# Patient Record
Sex: Male | Born: 1980 | Race: Black or African American | Hispanic: No | Marital: Married | Smoking: Never smoker
Health system: Southern US, Community
[De-identification: ages and names within clinical notes are randomized; demographics above are authoritative.]

## PROBLEM LIST (undated history)

## (undated) DIAGNOSIS — S0540XA Penetrating wound of orbit with or without foreign body, unspecified eye, initial encounter: Secondary | ICD-10-CM

## (undated) HISTORY — PX: KNEE SURGERY: SHX244

---
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
## 2004-11-12 ENCOUNTER — Emergency Department (HOSPITAL_COMMUNITY): Admission: EM | Admit: 2004-11-12 | Discharge: 2004-11-12 | Payer: Self-pay | Admitting: Emergency Medicine

## 2005-03-18 ENCOUNTER — Emergency Department (HOSPITAL_COMMUNITY): Admission: EM | Admit: 2005-03-18 | Discharge: 2005-03-18 | Payer: Self-pay | Admitting: *Deleted

## 2005-05-28 ENCOUNTER — Emergency Department (HOSPITAL_COMMUNITY): Admission: EM | Admit: 2005-05-28 | Discharge: 2005-05-28 | Payer: Self-pay | Admitting: Family Medicine

## 2005-12-01 ENCOUNTER — Emergency Department (HOSPITAL_COMMUNITY): Admission: EM | Admit: 2005-12-01 | Discharge: 2005-12-01 | Payer: Self-pay | Admitting: Emergency Medicine

## 2006-08-03 ENCOUNTER — Emergency Department (HOSPITAL_COMMUNITY): Admission: EM | Admit: 2006-08-03 | Discharge: 2006-08-03 | Payer: Self-pay | Admitting: Emergency Medicine

## 2007-02-09 ENCOUNTER — Emergency Department (HOSPITAL_COMMUNITY): Admission: EM | Admit: 2007-02-09 | Discharge: 2007-02-09 | Payer: Self-pay | Admitting: Emergency Medicine

## 2007-10-31 ENCOUNTER — Emergency Department (HOSPITAL_COMMUNITY): Admission: EM | Admit: 2007-10-31 | Discharge: 2007-10-31 | Payer: Self-pay | Admitting: Unknown Physician Specialty

## 2012-07-16 ENCOUNTER — Emergency Department (INDEPENDENT_AMBULATORY_CARE_PROVIDER_SITE_OTHER)
Admission: EM | Admit: 2012-07-16 | Discharge: 2012-07-16 | Disposition: A | Discharge status: Home / Self Care | Payer: Self-pay | Source: Home / Self Care | Attending: Emergency Medicine | Admitting: Emergency Medicine

## 2012-07-16 ENCOUNTER — Encounter (HOSPITAL_COMMUNITY): Payer: Self-pay | Admitting: Emergency Medicine

## 2012-07-16 DIAGNOSIS — L0100 Impetigo, unspecified: Secondary | ICD-10-CM

## 2012-07-16 DIAGNOSIS — L259 Unspecified contact dermatitis, unspecified cause: Secondary | ICD-10-CM

## 2012-07-16 HISTORY — DX: Penetrating wound of orbit with or without foreign body, unspecified eye, initial encounter: S05.40XA

## 2012-07-16 MED ORDER — METHYLPREDNISOLONE ACETATE 80 MG/ML IJ SUSP
80.0000 mg | Freq: Once | INTRAMUSCULAR | Status: AC
  Administered 2012-07-16: 80 mg via INTRAMUSCULAR

## 2012-07-16 MED ORDER — PREDNISONE 10 MG PO TABS: ORAL_TABLET | ORAL | Status: DC

## 2012-07-16 MED ORDER — SULFAMETHOXAZOLE-TMP DS 800-160 MG PO TABS: 1.0000 | ORAL_TABLET | Freq: Two times a day (BID) | ORAL | Status: DC

## 2012-07-16 MED ORDER — MUPIROCIN 2 % EX OINT: TOPICAL_OINTMENT | Freq: Three times a day (TID) | CUTANEOUS | Status: DC

## 2012-07-16 MED ORDER — HYDROXYZINE HCL 25 MG PO TABS: 25.0000 mg | ORAL_TABLET | Freq: Four times a day (QID) | ORAL | Status: DC | PRN

## 2012-07-16 MED ORDER — METHYLPREDNISOLONE ACETATE 80 MG/ML IJ SUSP
INTRAMUSCULAR | Status: AC
  Filled 2012-07-16: qty 1

## 2012-07-16 MED ORDER — CEPHALEXIN 500 MG PO CAPS: 500.0000 mg | ORAL_CAPSULE | Freq: Three times a day (TID) | ORAL | Status: DC

## 2012-07-16 NOTE — ED Provider Notes (Signed)
Chief Complaint:   Chief Complaint  Patient presents with  . Rash    History of Present Illness:   Calvin Fletcher is a 32 year old male who presents with a two-day history of rash on his arms and right ear. The rash is very itchy and the rash on the ear hurts and has been draining some fluid and pus. He is exposed to many chemicals at work, although nothing new. He also tried a new body wash. No other exposure to any other allergens. No new medications or foods. No exposure to plants, animals, or poison ivy.  Review of Systems:  Other than noted above, the patient denies any of the following symptoms: Systemic:  No fever, chills, sweats, weight loss, or fatigue. ENT:  No nasal congestion, rhinorrhea, sore throat, swelling of lips, tongue or throat. Resp:  No cough, wheezing, or shortness of breath. Skin:  No rash, itching, nodules, or suspicious lesions.  PMFSH:  Past medical history, family history, social history, meds, and allergies were reviewed.  Physical Exam:   Vital signs:  BP 137/88  Pulse 64  Temp(Src) 98.4 F (36.9 C) (Oral)  Resp 16  SpO2 100% Gen:  Alert, oriented, in no distress. ENT:  Pharynx clear, no intraoral lesions, moist mucous membranes. Lungs:  Clear to auscultation. Skin:  Rash is most marked on the face, head, and neck, it is characterized by round, raised, erythematous maculopapules some of which are crusted, this is most dense on the right ear with crusting and weeping. He has a few lesions on his arms. Skin is otherwise clear.       Course in Urgent Care Center:   Given Depo-Medrol 80 mg IM.  Assessment:  The primary encounter diagnosis was Contact dermatitis. A diagnosis of Impetigo was also pertinent to this visit.  This rash has the appearance of a contact dermatitis possibly with some secondary infection.  Plan:   1.  The following meds were prescribed:   Discharge Medication List as of 07/16/2012 12:38 PM    START taking these medications   Details  cephALEXin (KEFLEX) 500 MG capsule Take 1 capsule (500 mg total) by mouth 3 (three) times daily., Starting 07/16/2012, Until Discontinued, Normal    hydrOXYzine (ATARAX/VISTARIL) 25 MG tablet Take 1 tablet (25 mg total) by mouth every 6 (six) hours as needed for itching., Starting 07/16/2012, Until Discontinued, Normal    mupirocin ointment (BACTROBAN) 2 % Apply topically 3 (three) times daily., Starting 07/16/2012, Until Discontinued, Normal    predniSONE (DELTASONE) 10 MG tablet Take 4 tabs daily for 4 days, 3 tabs daily for 4 days, 2 tabs daily for 4 days, then 1 tab daily for 4 days., Normal    sulfamethoxazole-trimethoprim (BACTRIM DS) 800-160 MG per tablet Take 1 tablet by mouth 2 (two) times daily., Starting 07/16/2012, Until Discontinued, Normal       2.  The patient was instructed in symptomatic care and handouts were given. 3.  The patient was told to return if becoming worse in any way, if no better in 3 or 4 days, and given some red flag symptoms such as fever, or difficulty breathing that would indicate earlier return.     Reuben Likes, MD 07/16/12 [phone]

## 2012-07-16 NOTE — ED Notes (Signed)
Reports rash to right ear since last week, reports noticing rash to arms over the past few days.  Rash itches, somewhat hurts per patient.

## 2016-09-05 ENCOUNTER — Encounter (HOSPITAL_COMMUNITY): Payer: Self-pay | Admitting: Emergency Medicine

## 2016-09-05 ENCOUNTER — Ambulatory Visit (HOSPITAL_COMMUNITY)
Admission: EM | Admit: 2016-09-05 | Discharge: 2016-09-05 | Disposition: A | Discharge status: Home / Self Care | Payer: Self-pay | Attending: Internal Medicine | Admitting: Internal Medicine

## 2016-09-05 DIAGNOSIS — S0101XA Laceration without foreign body of scalp, initial encounter: Secondary | ICD-10-CM

## 2016-09-05 MED ORDER — TETANUS-DIPHTH-ACELL PERTUSSIS 5-2.5-18.5 LF-MCG/0.5 IM SUSP
0.5000 mL | Freq: Once | INTRAMUSCULAR | Status: AC
  Administered 2016-09-05: 0.5 mL via INTRAMUSCULAR

## 2016-09-05 MED ORDER — TETANUS-DIPHTH-ACELL PERTUSSIS 5-2.5-18.5 LF-MCG/0.5 IM SUSP
INTRAMUSCULAR | Status: AC
  Filled 2016-09-05: qty 0.5
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
## 2016-09-05 NOTE — Discharge Instructions (Signed)
We have updated your tetanus. Keep your wound clean, and dry. Monitor for signs of infection, leave the Steri-Strips in place for at least a week before removing. If you have any signs or symptoms of infection, return to clinic
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
## 2017-02-13 ENCOUNTER — Emergency Department (HOSPITAL_COMMUNITY)
Admission: EM | Admit: 2017-02-13 | Discharge: 2017-02-14 | Disposition: A | Discharge status: Home / Self Care | Payer: Self-pay | Attending: Emergency Medicine | Admitting: Emergency Medicine

## 2017-02-13 ENCOUNTER — Emergency Department (HOSPITAL_COMMUNITY): Payer: Self-pay

## 2017-02-13 ENCOUNTER — Encounter (HOSPITAL_COMMUNITY): Payer: Self-pay | Admitting: Emergency Medicine

## 2017-02-13 DIAGNOSIS — Y999 Unspecified external cause status: Secondary | ICD-10-CM | POA: Insufficient documentation

## 2017-02-13 DIAGNOSIS — S12591A Other nondisplaced fracture of sixth cervical vertebra, initial encounter for closed fracture: Secondary | ICD-10-CM | POA: Insufficient documentation

## 2017-02-13 DIAGNOSIS — S12491A Other nondisplaced fracture of fifth cervical vertebra, initial encounter for closed fracture: Secondary | ICD-10-CM | POA: Insufficient documentation

## 2017-02-13 DIAGNOSIS — Z23 Encounter for immunization: Secondary | ICD-10-CM | POA: Insufficient documentation

## 2017-02-13 DIAGNOSIS — S129XXA Fracture of neck, unspecified, initial encounter: Secondary | ICD-10-CM

## 2017-02-13 DIAGNOSIS — R030 Elevated blood-pressure reading, without diagnosis of hypertension: Secondary | ICD-10-CM | POA: Insufficient documentation

## 2017-02-13 DIAGNOSIS — S0101XA Laceration without foreign body of scalp, initial encounter: Secondary | ICD-10-CM | POA: Insufficient documentation

## 2017-02-13 DIAGNOSIS — S0081XA Abrasion of other part of head, initial encounter: Secondary | ICD-10-CM | POA: Insufficient documentation

## 2017-02-13 DIAGNOSIS — Y939 Activity, unspecified: Secondary | ICD-10-CM | POA: Insufficient documentation

## 2017-02-13 DIAGNOSIS — Y929 Unspecified place or not applicable: Secondary | ICD-10-CM | POA: Insufficient documentation

## 2017-02-13 DIAGNOSIS — S0990XA Unspecified injury of head, initial encounter: Secondary | ICD-10-CM | POA: Insufficient documentation

## 2017-02-13 DIAGNOSIS — X58XXXA Exposure to other specified factors, initial encounter: Secondary | ICD-10-CM | POA: Insufficient documentation

## 2017-02-13 DIAGNOSIS — Z043 Encounter for examination and observation following other accident: Secondary | ICD-10-CM | POA: Insufficient documentation

## 2017-02-13 DIAGNOSIS — F10920 Alcohol use, unspecified with intoxication, uncomplicated: Secondary | ICD-10-CM | POA: Insufficient documentation

## 2017-02-13 LAB — I-STAT CHEM 8, ED
BUN: 4 mg/dL — ABNORMAL LOW (ref 6–20)
CALCIUM ION: 1 mmol/L — AB (ref 1.15–1.40)
CREATININE: 1 mg/dL (ref 0.61–1.24)
Chloride: 105 mmol/L (ref 101–111)
GLUCOSE: 115 mg/dL — AB (ref 65–99)
HCT: 48 % (ref 39.0–52.0)
Hemoglobin: 16.3 g/dL (ref 13.0–17.0)
POTASSIUM: 3.7 mmol/L (ref 3.5–5.1)
Sodium: 140 mmol/L (ref 135–145)
TCO2: 23 mmol/L (ref 22–32)

## 2017-02-13 LAB — CBC
HCT: 41.8 % (ref 39.0–52.0)
HEMOGLOBIN: 14 g/dL (ref 13.0–17.0)
MCH: 31 pg (ref 26.0–34.0)
MCHC: 33.5 g/dL (ref 30.0–36.0)
MCV: 92.7 fL (ref 78.0–100.0)
PLATELETS: 220 10*3/uL (ref 150–400)
RBC: 4.51 MIL/uL (ref 4.22–5.81)
RDW: 12.4 % (ref 11.5–15.5)
WBC: 12.4 10*3/uL — AB (ref 4.0–10.5)

## 2017-02-13 LAB — I-STAT CG4 LACTIC ACID, ED: LACTIC ACID, VENOUS: 1.93 mmol/L — AB (ref 0.5–1.9)

## 2017-02-13 LAB — SAMPLE TO BLOOD BANK

## 2017-02-13 MED ORDER — IOPAMIDOL (ISOVUE-300) INJECTION 61%
INTRAVENOUS | Status: AC
  Administered 2017-02-13: 100 mL
  Filled 2017-02-13: qty 100

## 2017-02-13 MED ORDER — SODIUM CHLORIDE 0.9 % IV BOLUS (SEPSIS)
1000.0000 mL | Freq: Once | INTRAVENOUS | Status: AC
  Administered 2017-02-14: 1000 mL via INTRAVENOUS

## 2017-02-13 MED ORDER — SODIUM CHLORIDE 0.9 % IV SOLN
INTRAVENOUS | Status: DC
  Administered 2017-02-14: 05:00:00 via INTRAVENOUS

## 2017-02-13 MED ORDER — LIDOCAINE-EPINEPHRINE (PF) 2 %-1:200000 IJ SOLN
20.0000 mL | Freq: Once | INTRAMUSCULAR | Status: AC
  Administered 2017-02-14: 20 mL via INTRADERMAL
  Filled 2017-02-13: qty 20

## 2017-02-13 MED ORDER — TETANUS-DIPHTH-ACELL PERTUSSIS 5-2.5-18.5 LF-MCG/0.5 IM SUSP
0.5000 mL | Freq: Once | INTRAMUSCULAR | Status: AC
Start: 2017-02-13 — End: 2017-02-13
  Administered 2017-02-13: 0.5 mL via INTRAMUSCULAR

## 2017-02-13 MED ORDER — FENTANYL CITRATE (PF) 100 MCG/2ML IJ SOLN
50.0000 ug | Freq: Once | INTRAMUSCULAR | Status: AC
  Administered 2017-02-14: 50 ug via INTRAVENOUS
  Filled 2017-02-13: qty 2

## 2017-02-13 MED ORDER — TETANUS-DIPHTH-ACELL PERTUSSIS 5-2.5-18.5 LF-MCG/0.5 IM SUSP
INTRAMUSCULAR | Status: AC
  Filled 2017-02-13: qty 0.5

## 2017-02-13 MED ORDER — ONDANSETRON HCL 4 MG/2ML IJ SOLN
4.0000 mg | Freq: Once | INTRAMUSCULAR | Status: AC
  Administered 2017-02-14: 4 mg via INTRAVENOUS
  Filled 2017-02-13: qty 2
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
## 2017-02-14 ENCOUNTER — Emergency Department (HOSPITAL_COMMUNITY): Payer: Self-pay

## 2017-02-14 ENCOUNTER — Encounter (HOSPITAL_COMMUNITY): Payer: Self-pay | Admitting: Emergency Medicine

## 2017-02-14 ENCOUNTER — Telehealth: Payer: Self-pay | Admitting: *Deleted

## 2017-02-14 LAB — URINALYSIS, ROUTINE W REFLEX MICROSCOPIC
Bacteria, UA: NONE SEEN
Bilirubin Urine: NEGATIVE
GLUCOSE, UA: NEGATIVE mg/dL
Ketones, ur: NEGATIVE mg/dL
Leukocytes, UA: NEGATIVE
Nitrite: NEGATIVE
PROTEIN: NEGATIVE mg/dL
RBC / HPF: NONE SEEN RBC/hpf (ref 0–5)
SQUAMOUS EPITHELIAL / LPF: NONE SEEN
Specific Gravity, Urine: 1.009 (ref 1.005–1.030)
WBC UA: NONE SEEN WBC/hpf (ref 0–5)
pH: 6 (ref 5.0–8.0)

## 2017-02-14 LAB — I-STAT CHEM 8, ED
BUN: 6 mg/dL (ref 6–20)
CALCIUM ION: 0.93 mmol/L — AB (ref 1.15–1.40)
Chloride: 105 mmol/L (ref 101–111)
Creatinine, Ser: 1.1 mg/dL (ref 0.61–1.24)
Glucose, Bld: 120 mg/dL — ABNORMAL HIGH (ref 65–99)
HCT: 44 % (ref 39.0–52.0)
HEMOGLOBIN: 15 g/dL (ref 13.0–17.0)
SODIUM: 135 mmol/L (ref 135–145)
TCO2: 27 mmol/L (ref 22–32)

## 2017-02-14 LAB — COMPREHENSIVE METABOLIC PANEL
ALK PHOS: 61 U/L (ref 38–126)
ALT: 40 U/L (ref 17–63)
AST: 70 U/L — ABNORMAL HIGH (ref 15–41)
Albumin: 4 g/dL (ref 3.5–5.0)
Anion gap: 9 (ref 5–15)
BUN: 5 mg/dL — AB (ref 6–20)
CALCIUM: 8.9 mg/dL (ref 8.9–10.3)
CO2: 23 mmol/L (ref 22–32)
CREATININE: 0.83 mg/dL (ref 0.61–1.24)
Chloride: 106 mmol/L (ref 101–111)
GFR, EST AFRICAN AMERICAN: 60 mL/min — AB (ref >60)
GFR, EST NON AFRICAN AMERICAN: 52 mL/min — AB (ref >60)
Glucose, Bld: 116 mg/dL — ABNORMAL HIGH (ref 65–99)
Potassium: 3.9 mmol/L (ref 3.5–5.1)
Sodium: 138 mmol/L (ref 135–145)
Total Bilirubin: 0.6 mg/dL (ref 0.3–1.2)
Total Protein: 6.6 g/dL (ref 6.5–8.1)

## 2017-02-14 LAB — RAPID URINE DRUG SCREEN, HOSP PERFORMED
AMPHETAMINES: NOT DETECTED
BENZODIAZEPINES: NOT DETECTED
Barbiturates: NOT DETECTED
COCAINE: NOT DETECTED
Opiates: NOT DETECTED
Tetrahydrocannabinol: NOT DETECTED

## 2017-02-14 LAB — PROTIME-INR
INR: 0.89
PROTHROMBIN TIME: 12 s (ref 11.4–15.2)

## 2017-02-14 LAB — ETHANOL: ALCOHOL ETHYL (B): 286 mg/dL — AB (ref <10)

## 2017-02-14 MED ORDER — MORPHINE SULFATE (PF) 4 MG/ML IV SOLN
4.0000 mg | Freq: Once | INTRAVENOUS | Status: AC
  Administered 2017-02-14: 4 mg via INTRAVENOUS
  Filled 2017-02-14: qty 1

## 2017-02-14 MED ORDER — OXYCODONE-ACETAMINOPHEN 5-325 MG PO TABS: 1.0000 | ORAL_TABLET | Freq: Four times a day (QID) | ORAL | 0 refills | Status: DC | PRN

## 2017-02-14 MED ORDER — IOPAMIDOL (ISOVUE-370) INJECTION 76%
INTRAVENOUS | Status: AC
  Administered 2017-02-14: 01:00:00
  Filled 2017-02-14: qty 50

## 2017-02-14 MED ORDER — ONDANSETRON 4 MG PO TBDP: 4.0000 mg | ORAL_TABLET | Freq: Three times a day (TID) | ORAL | 0 refills | Status: DC | PRN

## 2017-02-14 NOTE — ED Provider Notes (Signed)
LACERATION REPAIR Performed by: Roxy HorsemanBROWNING, Salaya Holtrop Authorized by: Roxy HorsemanBROWNING, Makana Rostad Consent: Verbal consent obtained. Risks and benefits: risks, benefits and alternatives were discussed Consent given by: patient Patient identity confirmed: provided demographic data Prepped and Draped in normal sterile fashion Wound explored  Laceration Location: scalp  Laceration Length: 10 cm  No Foreign Bodies seen or palpated  Anesthesia: local infiltration  Local anesthetic: lidocaine 1% with epinephrine  Anesthetic total: 8 ml  Irrigation method: syringe Amount of cleaning: standard  Skin closure: 5-0 prolene  Number of sutures: 16  Technique: simple interrupted  Patient tolerance: Patient tolerated the procedure well with no immediate complications.    Roxy HorsemanBrowning, Eesha Schmaltz, PA-C 02/14/17 0235    Ward, Layla MawKristen N, DO 02/14/17 [phone]

## 2017-02-14 NOTE — ED Notes (Signed)
Pt able to ambulated on the hallway with steady gait, no dizziness reported. PO challenge started.

## 2017-02-14 NOTE — Discharge Instructions (Addendum)
Please follow-up with Dr. Yetta BarreJones with neurosurgery in 2 weeks.  You need to wear your aspen cervical collar at all times.  Please do not take it off.  You have 2 cervical fractures at C5 and C6  Your CT scans were otherwise normal.    To find a primary care or specialty doctor please call [phone] or [phone] to access "[facility] Find a Doctor Service."  You may also go on the [facility] website at [email or web address]  There are also multiple [facility], [facility] practices throughout the [place] that are frequently accepting new patients. You may find a clinic that is close to your home and contact them.  [facility] -  [address] [place] [id number] [phone]   [facility] -  [address] [place] [address] [phone]   [facility] [phone]  [place] [phone]

## 2017-02-14 NOTE — Telephone Encounter (Signed)
Pharmacy called related to Rx: zofran being too expensive .Marland Kitchen.Marland Kitchen.EDCM clarified with EDP ([place]) to change Rx to: phenergan 25mg  take 1 tablet Q 8 hours PRN nausea #20.

## 2017-02-21 ENCOUNTER — Emergency Department (HOSPITAL_COMMUNITY)
Admission: EM | Admit: 2017-02-21 | Discharge: 2017-02-21 | Disposition: A | Discharge status: Home / Self Care | Payer: No Typology Code available for payment source | Attending: Emergency Medicine | Admitting: Emergency Medicine

## 2017-02-21 ENCOUNTER — Encounter (HOSPITAL_COMMUNITY): Payer: Self-pay | Admitting: Emergency Medicine

## 2017-02-21 DIAGNOSIS — S0101XD Laceration without foreign body of scalp, subsequent encounter: Secondary | ICD-10-CM | POA: Diagnosis not present

## 2017-02-21 DIAGNOSIS — Z4802 Encounter for removal of sutures: Secondary | ICD-10-CM

## 2017-02-21 NOTE — Discharge Instructions (Signed)
Keep the wound clean and dry.  Continue applying antibacterial ointment.  Closely monitor for any signs of infection, including but not limited to redness or swelling or wound, drainage from the wound, fevers or any other worsening or concerning symptoms.  Return to the emergency department immediately if you experiencing any of these acute symptoms.
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
## 2017-02-21 NOTE — ED Triage Notes (Signed)
Pt here for stiches removed from scalp.

## 2017-05-29 ENCOUNTER — Ambulatory Visit (HOSPITAL_COMMUNITY)
Admission: EM | Admit: 2017-05-29 | Discharge: 2017-05-29 | Disposition: A | Discharge status: Home / Self Care | Payer: Self-pay | Attending: Internal Medicine | Admitting: Internal Medicine

## 2017-05-29 ENCOUNTER — Encounter (HOSPITAL_COMMUNITY): Payer: Self-pay | Admitting: Emergency Medicine

## 2017-05-29 DIAGNOSIS — K219 Gastro-esophageal reflux disease without esophagitis: Secondary | ICD-10-CM

## 2017-05-29 DIAGNOSIS — J029 Acute pharyngitis, unspecified: Secondary | ICD-10-CM

## 2017-05-29 LAB — POCT RAPID STREP A: STREPTOCOCCUS, GROUP A SCREEN (DIRECT): NEGATIVE

## 2017-05-29 MED ORDER — FAMOTIDINE 20 MG PO TABS: 20.0000 mg | ORAL_TABLET | Freq: Two times a day (BID) | ORAL | 0 refills | Status: DC

## 2017-05-29 NOTE — ED Provider Notes (Signed)
05/29/2017 2:56 PM   DOB: September 16, 1980 / MRN: [id number]  SUBJECTIVE:  Calvin Fletcher is a 37 y.o. male presenting sore throat mostly on the left side.  This started a day and a half ago.  He denies nasal congestion.  Does have a mild cough that is abnormal for him.  Denies fever.  States he has been having heartburn more frequently lately and 2 days ago his heartburn was "really bad" and occurs shortly after eating only.  He denies any new medications.  He denies bloody stools.  He is urinating normally.  He has No Known Allergies.   He  has a past medical history of Foreign body behind the eye.    He  reports that  has never smoked. he has never used smokeless tobacco. He reports that he drinks alcohol. He reports that he does not use drugs. He  has no sexual activity history on file. The patient  has a past surgical history that includes Knee surgery.  His family history is not on file.  Review of Systems  Constitutional: Negative for fever.  HENT: Positive for sore throat. Negative for congestion.   Respiratory: Positive for cough.   Cardiovascular: Negative for chest pain.  Gastrointestinal: Positive for heartburn. Negative for abdominal pain, nausea and vomiting.  Skin: Negative for itching and rash.  Neurological: Negative for dizziness.    OBJECTIVE:  BP 108/74 (BP Location: Left Arm)   Pulse 93   Temp 98.1 F (36.7 C) (Oral)   Resp 20   SpO2 97%   Physical Exam  Constitutional: He appears well-developed. He is active and cooperative.  Non-toxic appearance.  HENT:  Right Ear: Hearing, tympanic membrane, external ear and ear canal normal.  Left Ear: Hearing, tympanic membrane, external ear and ear canal normal.  Nose: Nose normal. Right sinus exhibits no maxillary sinus tenderness and no frontal sinus tenderness. Left sinus exhibits no maxillary sinus tenderness and no frontal sinus tenderness.  Mouth/Throat: Uvula is midline, oropharynx is clear and moist and mucous  membranes are normal. No oropharyngeal exudate, posterior oropharyngeal edema or tonsillar abscesses.  Eyes: Conjunctivae are normal. Pupils are equal, round, and reactive to light.  Cardiovascular: Normal rate, regular rhythm, S1 normal, S2 normal, normal heart sounds, intact distal pulses and normal pulses. Exam reveals no gallop and no friction rub.  No murmur heard. Pulmonary/Chest: Effort normal. No stridor. No tachypnea. No respiratory distress. He has no wheezes. He has no rales.  Abdominal: Soft. Normal appearance and bowel sounds are normal. He exhibits no distension and no mass. There is no tenderness. There is no rigidity, no rebound, no guarding and no CVA tenderness. No hernia.  Musculoskeletal: He exhibits no edema.  Lymphadenopathy:       Head (right side): No submandibular and no tonsillar adenopathy present.       Head (left side): No submandibular and no tonsillar adenopathy present.    He has no cervical adenopathy.  Neurological: He is alert.  Skin: Skin is warm and dry. He is not diaphoretic. No pallor.  Vitals reviewed.   Results for orders placed or performed during the hospital encounter of 05/29/17 (from the past 72 hour(s))  POCT rapid strep A [facility])     Status: None   Collection Time: 05/29/17  2:04 PM  Result Value Ref Range   Streptococcus, Group A Screen (Direct) NEGATIVE NEGATIVE    No results found.  ASSESSMENT AND PLAN:  Orders Placed This Encounter  Procedures  .  Culture, group A strep (throat)    Standing Status:   Standing    Number of Occurrences:   1  . POCT rapid strep A [facility]    Standing Status:   Standing    Number of Occurrences:   1     Gastroesophageal reflux disease, esophagitis presence not specified - Patient here with isolated complaint of sore throat without nasal congestion.  His exam is normal.  He does have GERD.  I will treat that etiology and then I am advising Tylenol for throat pain.  [facility] to set up a primary care provider appointment.  Sore throat      The patient is advised to call or return to clinic if he does not see an improvement in symptoms, or to seek the care of the closest emergency department if he worsens with the above plan.   Deliah BostonMichael Colm Lyford, MHS, PA-C 05/29/2017 2:56 PM   Ofilia Neaslark, Denario Bagot L, PA-C 05/29/17 1456

## 2017-05-29 NOTE — ED Triage Notes (Signed)
PT C/O: sore throat onset yest associated w/dsyphaiga and submandibular swelling on left side.   DENIES: fevers  TAKING MEDS: Ibuprofen at 0700 today   A&O x4... NAD... Ambulatory

## 2017-05-29 NOTE — ED Notes (Signed)
Called x2 for room 

## 2017-05-29 NOTE — Discharge Instructions (Signed)
I would suggest taking Tylenol along with the famotidine for your sore throat over the next few days.  I would avoid NSAIDs such as ibuprofen, Aleve, aspirin, Goody's as a may aggravate her stomach and thus make your throat pain worse.

## 2017-06-01 LAB — CULTURE, GROUP A STREP (THRC)

## 2018-04-29 ENCOUNTER — Ambulatory Visit
Admission: EM | Admit: 2018-04-29 | Discharge: 2018-04-29 | Disposition: A | Discharge status: Home / Self Care | Payer: Medicaid Other | Attending: Physician Assistant | Admitting: Physician Assistant

## 2018-04-29 ENCOUNTER — Encounter: Payer: Self-pay | Admitting: Emergency Medicine

## 2018-04-29 DIAGNOSIS — S46912A Strain of unspecified muscle, fascia and tendon at shoulder and upper arm level, left arm, initial encounter: Secondary | ICD-10-CM | POA: Insufficient documentation

## 2018-04-29 MED ORDER — IBUPROFEN 600 MG PO TABS: 600.0000 mg | ORAL_TABLET | Freq: Four times a day (QID) | ORAL | 0 refills | Status: DC | PRN

## 2018-04-29 NOTE — ED Provider Notes (Signed)
[name]    CSN: [id number] Arrival date & time: 04/29/18  1448     History   Chief Complaint Chief Complaint  Patient presents with  . Shoulder Pain    HPI Calvin Fletcher is a 38 y.o. male.   The history is provided by the patient. No language interpreter was used.  Shoulder Pain  Location:  Shoulder Shoulder location:  L shoulder Injury: no   Pain details:    Quality:  Aching   Radiates to:  Does not radiate   Severity:  No pain   Timing:  Constant   Progression:  Worsening Handedness:  Left-handed Dislocation: no   Prior injury to area:  Yes Relieved by:  Nothing Worsened by:  Nothing Associated symptoms: no back pain    Pt reports he lifted weights and began having pain in his left shoulder,  Pt has pain with moving Past Medical History:  Diagnosis Date  . Foreign body behind the eye    bb behind right eye /incident in 1991?    There are no active problems to display for this patient.   Past Surgical History:  Procedure Laterality Date  . KNEE SURGERY         Home Medications    Prior to Admission medications   Medication Sig Start Date End Date Taking? Authorizing Provider  famotidine (PEPCID) 20 MG tablet Take 1 tablet (20 mg total) by mouth 2 (two) times daily. Take first thing in the morning and before supper in the evening. 05/29/17   Ofilia Neas, PA-C  ibuprofen (ADVIL,MOTRIN) 600 MG tablet Take 1 tablet (600 mg total) by mouth every 6 (six) hours as needed. 04/29/18   Elson Areas, PA-C    Family History History reviewed. No pertinent family history.  Social History Social History   Tobacco Use  . Smoking status: Never Smoker  . Smokeless tobacco: Never Used  Substance Use Topics  . Alcohol use: Yes  . Drug use: No     Allergies   Patient has no known allergies.   Review of Systems Review of Systems  Musculoskeletal: Negative for back pain.  All other systems reviewed and are  negative.    Physical Exam Triage Vital Signs ED Triage Vitals [04/29/18 1459]  Enc Vitals Group     BP (!) 147/105     Pulse Rate 91     Resp 16     Temp 97.7 F (36.5 C)     Temp Source Oral     SpO2 98 %     Weight      Height      Head Circumference      Peak Flow      Pain Score 5     Pain Loc      Pain Edu?      Excl. in GC?    No data found.  Updated Vital Signs BP (!) 147/105 (BP Location: Left Arm)   Pulse 91   Temp 97.7 F (36.5 C) (Oral)   Resp 16   SpO2 98%   Visual Acuity Right Eye Distance:   Left Eye Distance:   Bilateral Distance:    Right Eye Near:   Left Eye Near:    Bilateral Near:     Physical Exam Vitals signs and nursing note reviewed.  Constitutional:      Appearance: He is well-developed.  HENT:     Head: Normocephalic and atraumatic.     Right Ear:  Tympanic membrane normal.     Left Ear: Tympanic membrane normal.     Nose: Nose normal.  Eyes:     Conjunctiva/sclera: Conjunctivae normal.  Neck:     Musculoskeletal: Neck supple.  Cardiovascular:     Rate and Rhythm: Normal rate and regular rhythm.     Heart sounds: No murmur.  Pulmonary:     Effort: Pulmonary effort is normal. No respiratory distress.     Breath sounds: Normal breath sounds.  Abdominal:     Palpations: Abdomen is soft.     Tenderness: There is no abdominal tenderness.  Musculoskeletal: Normal range of motion.        General: Tenderness present.  Skin:    General: Skin is warm and dry.  Neurological:     General: No focal deficit present.     Mental Status: He is alert.      UC Treatments / Results  Labs (all labs ordered are listed, but only abnormal results are displayed) Labs Reviewed - No data to display  EKG None  Radiology No results found.  Procedures Procedures (including critical care time)  Medications Ordered in UC Medications - No data to display  Initial Impression / Assessment and Plan / UC Course  I have reviewed the  triage vital signs and the nursing notes.  Pertinent labs & imaging results that were available during my care of the patient were reviewed by me and considered in my medical decision making (see chart for details).     MDM  Pt seems to have a rotator cuff tendonitis .  Pt advised ibuprofen and rest.  Final Clinical Impressions(s) / UC Diagnoses   Final diagnoses:  Shoulder strain, left, initial encounter     Discharge Instructions     Return if any problems.    ED Prescriptions    Medication Sig Dispense Auth. Provider   ibuprofen (ADVIL,MOTRIN) 600 MG tablet Take 1 tablet (600 mg total) by mouth every 6 (six) hours as needed. 30 tablet Elson AreasSofia, Desteni Piscopo K, [place]PA-C     Controlled Substance Prescriptions [place] Controlled Substance Registry consulted? Not Applicable   Elson AreasSofia, Talisa Petrak K, [place]-C 04/29/18 1524
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
## 2018-04-29 NOTE — Discharge Instructions (Addendum)
Return if any problems.

## 2018-04-29 NOTE — ED Notes (Signed)
Patient able to ambulate independently  

## 2018-06-18 ENCOUNTER — Ambulatory Visit
Admission: EM | Admit: 2018-06-18 | Discharge: 2018-06-18 | Disposition: A | Discharge status: Home / Self Care | Payer: Medicaid Other | Attending: Family Medicine | Admitting: Family Medicine

## 2018-06-18 ENCOUNTER — Encounter: Payer: Self-pay | Admitting: Emergency Medicine

## 2018-06-18 DIAGNOSIS — L42 Pityriasis rosea: Secondary | ICD-10-CM

## 2018-06-18 MED ORDER — TRIAMCINOLONE ACETONIDE 0.1 % EX CREA: 1.0000 "application " | TOPICAL_CREAM | Freq: Two times a day (BID) | CUTANEOUS | 0 refills | Status: DC

## 2018-06-18 NOTE — ED Provider Notes (Signed)
[facility]   [id number] 06/18/18 Arrival Time: 1550  CC:  Rash  SUBJECTIVE:  Calvin Fletcher is a 38 y.o. male who presents with rash x 1 month.  Denies precipitating event or trauma.  Denies changes in soaps, detergents, close contacts with similar rash, known trigger or environmental trigger, allergy. Denies medications change or starting a new medication recently.  Does state having exposure to refined oil from vats at work.  Rash is diffuse about the trunk. Denies pain or itching.  Has tried OTC medications without relief.  Denies aggravating factors.  Denies fever, chills, nausea, vomiting, erythema, swelling, discharge, oral lesions, SOB, chest pain, abdominal pain, changes in bowel or bladder function.    ROS: As per HPI.  Past Medical History:  Diagnosis Date  . Foreign body behind the eye    bb behind right eye /incident in 1991?   Past Surgical History:  Procedure Laterality Date  . KNEE SURGERY     No Known Allergies No current facility-administered medications on file prior to encounter.    Current Outpatient Medications on File Prior to Encounter  Medication Sig Dispense Refill  . famotidine (PEPCID) 20 MG tablet Take 1 tablet (20 mg total) by mouth 2 (two) times daily. Take first thing in the morning and before supper in the evening. 20 tablet 0  . ibuprofen (ADVIL,MOTRIN) 600 MG tablet Take 1 tablet (600 mg total) by mouth every 6 (six) hours as needed. 30 tablet 0   Social History   Socioeconomic History  . Marital status: Married    Spouse name: Not on file  . Number of children: Not on file  . Years of education: Not on file  . Highest education level: Not on file  Occupational History  . Not on file  Social Needs  . Financial resource strain: Not on file  . Food insecurity:    Worry: Not on file    Inability: Not on file  . Transportation needs:    Medical: Not on file    Non-medical: Not on file  Tobacco Use  . Smoking status: Never  Smoker  . Smokeless tobacco: Never Used  Substance and Sexual Activity  . Alcohol use: Yes  . Drug use: No  . Sexual activity: Not on file  Lifestyle  . Physical activity:    Days per week: Not on file    Minutes per session: Not on file  . Stress: Not on file  Relationships  . Social connections:    Talks on phone: Not on file    Gets together: Not on file    Attends religious service: Not on file    Active member of club or organization: Not on file    Attends meetings of clubs or organizations: Not on file    Relationship status: Not on file  . Intimate partner violence:    Fear of current or ex partner: Not on file    Emotionally abused: Not on file    Physically abused: Not on file    Forced sexual activity: Not on file  Other Topics Concern  . Not on file  Social History Narrative   ** Merged History Encounter **       History reviewed. No pertinent family history.  OBJECTIVE: Vitals:   06/18/18 1636  BP: (!) 152/96  Pulse: 84  Resp: 16  Temp: 97.8 F (36.6 C)  TempSrc: Oral  SpO2: 97%    General appearance: alert; no distress Head: NCAT Lungs:  clear to auscultation bilaterally Heart: regular rate and rhythm.  Radial pulse 2+ bilaterally Extremities: no edema Skin: warm and dry; crops of oval, sharply delimited, pink papules on anterior and posterior trunk oriented along skin lines, NTTP, no obvious drainage or bleeding (see picture below) Psychological: alert and cooperative; normal mood and affect        ASSESSMENT & PLAN:  1. Pityriasis rosea     Meds ordered this encounter  Medications  . triamcinolone cream (KENALOG) 0.1 %    Sig: Apply 1 application topically 2 (two) times daily.    Dispense:  30 g    Refill:  0    Order Specific Question:   Supervising Provider    Answer:   Eustace Moore [[id number]]   Rest and push fluids Triamcinolone cream prescribed.  Use as needed for symptomatic relief. Follow up with PCP or with [facility] if symptoms persists Return or go to the ER if you have any new or worsening symptoms such as fever, chills, nausea, vomiting, redness, swelling, discharge, if symptoms do not improve with medications, etc...  Reviewed expectations re: course of current medical issues. Questions answered. Outlined signs and symptoms indicating need for more acute intervention. Patient verbalized understanding. After Visit Summary given.   Rennis Harding, PA-C 06/19/18 [phone]

## 2018-06-18 NOTE — ED Notes (Signed)
Patient able to ambulate independently  

## 2018-06-18 NOTE — Discharge Instructions (Addendum)
Rest and push fluids Triamcinolone cream prescribed.  Use as needed for symptomatic relief. Follow up with PCP or with [facility] if symptoms persists Return or go to the ER if you have any new or worsening symptoms such as fever, chills, nausea, vomiting, redness, swelling, discharge, if symptoms do not improve with medications, etc..Marland Kitchen

## 2018-06-18 NOTE — ED Triage Notes (Signed)
Pt presents to [facility] for assessment after having exposure to refined oil from vats from work.  States it started with one spot under his right nipple 1 month ago, and has spread to his whole chest and back.

## 2018-10-28 ENCOUNTER — Ambulatory Visit
Admission: EM | Admit: 2018-10-28 | Discharge: 2018-10-28 | Disposition: A | Discharge status: Home / Self Care | Payer: Medicaid Other | Attending: Physician Assistant | Admitting: Physician Assistant

## 2018-10-28 DIAGNOSIS — L249 Irritant contact dermatitis, unspecified cause: Secondary | ICD-10-CM

## 2018-10-28 MED ORDER — PREDNISONE 50 MG PO TABS: 50.0000 mg | ORAL_TABLET | Freq: Every day | ORAL | 0 refills | Status: DC

## 2018-10-28 MED ORDER — METHYLPREDNISOLONE SODIUM SUCC 125 MG IJ SOLR
80.0000 mg | Freq: Once | INTRAMUSCULAR | Status: AC
  Administered 2018-10-28: 80 mg via INTRAMUSCULAR

## 2018-10-28 NOTE — Discharge Instructions (Signed)
Solumedrol injection in office today. Start prednisone as directed. Avoid irritants to the skin. Monitor for spreading redness, warmth, pain, fever, follow up for reevaluation needed.

## 2018-10-28 NOTE — ED Provider Notes (Signed)
[name]    CSN: [id number] Arrival date & time: 10/28/18  1414     History   Chief Complaint Chief Complaint  Patient presents with  . Poison Ivy    HPI Calvin Fletcher is a 38 y.o. male.   38 year old male comes in for few day history of rash throughout the body.  He believes this is due to poison ivy as he works outside [employer] and has similar experience.  States when symptoms first started, he cleaned his whole body with alcohol, then poured Clorox.  He has since then been using calamine lotion without relief.  States rash throughout the whole body is itching and sensation.  Denies spreading erythema, warmth.  Denies pain.  Denies fever, chills, night sweats.     Past Medical History:  Diagnosis Date  . Foreign body behind the eye    bb behind right eye /incident in 1991?    There are no active problems to display for this patient.   Past Surgical History:  Procedure Laterality Date  . KNEE SURGERY         Home Medications    Prior to Admission medications   Medication Sig Start Date End Date Taking? Authorizing Provider  famotidine (PEPCID) 20 MG tablet Take 1 tablet (20 mg total) by mouth 2 (two) times daily. Take first thing in the morning and before supper in the evening. 05/29/17   Ofilia Neaslark, Michael L, PA-C  ibuprofen (ADVIL,MOTRIN) 600 MG tablet Take 1 tablet (600 mg total) by mouth every 6 (six) hours as needed. 04/29/18   Elson AreasSofia, Leslie K, PA-C  predniSONE (DELTASONE) 50 MG tablet Take 1 tablet (50 mg total) by mouth daily with breakfast. 10/28/18   Cathie HoopsYu, Amy V, PA-C  triamcinolone cream (KENALOG) 0.1 % Apply 1 application topically 2 (two) times daily. 06/18/18   Wurst, [place], PA-C    Family History No family history on file.  Social History Social History   Tobacco Use  . Smoking status: Never Smoker  . Smokeless tobacco: Never Used  Substance Use Topics  . Alcohol use: Yes  . Drug use: No     Allergies   Patient has no  known allergies.   Review of Systems Review of Systems  Reason unable to perform ROS: See HPI as above.     Physical Exam Triage Vital Signs ED Triage Vitals [10/28/18 1422]  Enc Vitals Group     BP (!) 161/92     Pulse Rate 62     Resp 18     Temp 98.1 F (36.7 C)     Temp Source Oral     SpO2 98 %     Weight      Height      Head Circumference      Peak Flow      Pain Score 0     Pain Loc      Pain Edu?      Excl. in GC?    No data found.  Updated Vital Signs BP (!) 161/92 (BP Location: Left Arm)   Pulse 62   Temp 98.1 F (36.7 C) (Oral)   Resp 18   SpO2 98%   Physical Exam Constitutional:      General: He is not in acute distress.    Appearance: He is well-developed. He is not diaphoretic.  HENT:     Head: Normocephalic and atraumatic.  Eyes:     Conjunctiva/sclera: Conjunctivae normal.  Pupils: Pupils are equal, round, and reactive to light.  Skin:    Comments: Maculopapular rash throughout abdomen, bilateral upper extremity. Similar rash to the right lower lateral leg. Surrounding erythema. No swelling. No pain on palpation.  No vesicles/bulla  Neurological:     Mental Status: He is alert and oriented to person, place, and time.      UC Treatments / Results  Labs (all labs ordered are listed, but only abnormal results are displayed) Labs Reviewed - No data to display  EKG   Radiology No results found.  Procedures Procedures (including critical care time)  Medications Ordered in UC Medications  methylPREDNISolone sodium succinate (SOLU-MEDROL) 125 mg/2 mL injection 80 mg (80 mg Intramuscular Given 10/28/18 1441)    Initial Impression / Assessment and Plan / UC Course  I have reviewed the triage vital signs and the nursing notes.  Pertinent labs & imaging results that were available during my care of the patient were reviewed by me and considered in my medical decision making (see chart for details).    History and exam most  consistent with contact/irritant dermatitis.  Discussed this could be due to poison ivy, but also could be due to using Clorox on skin.  Patient requesting injection for symptoms, will provide Solu-Medrol injection.  Will also start prednisone as directed.  Return precaution given.  Patient expresses understanding and agrees to plan.  Final Clinical Impressions(s) / UC Diagnoses   Final diagnoses:  Irritant contact dermatitis, unspecified trigger    ED Prescriptions    Medication Sig Dispense Auth. Provider   predniSONE (DELTASONE) 50 MG tablet Take 1 tablet (50 mg total) by mouth daily with breakfast. 5 tablet Tobin Chad, [place] 10/28/18 1621

## 2018-10-28 NOTE — ED Triage Notes (Signed)
Pt states has poison oak, states hx of same, works outside [employer]

## 2019-01-27 ENCOUNTER — Encounter: Payer: Self-pay | Admitting: Emergency Medicine

## 2019-01-27 ENCOUNTER — Other Ambulatory Visit: Payer: Self-pay

## 2019-01-27 ENCOUNTER — Ambulatory Visit
Admission: EM | Admit: 2019-01-27 | Discharge: 2019-01-27 | Disposition: A | Discharge status: Home / Self Care | Payer: Commercial Managed Care - PPO | Attending: Emergency Medicine | Admitting: Emergency Medicine

## 2019-01-27 DIAGNOSIS — M25572 Pain in left ankle and joints of left foot: Secondary | ICD-10-CM | POA: Diagnosis not present

## 2019-01-27 NOTE — Discharge Instructions (Addendum)
May ice, rest, elevate the area(s) of pain.  May use OTC Tylenol, ibuprofen as needed for pain. Return if you develop worsening pain, difficulty walking, or have an injury.

## 2019-01-27 NOTE — ED Notes (Signed)
Patient able to ambulate independently  
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
## 2019-06-23 ENCOUNTER — Ambulatory Visit
Admission: EM | Admit: 2019-06-23 | Discharge: 2019-06-23 | Disposition: A | Discharge status: Home / Self Care | Payer: Commercial Managed Care - PPO | Attending: Internal Medicine | Admitting: Internal Medicine

## 2019-06-23 ENCOUNTER — Encounter: Payer: Self-pay | Admitting: Emergency Medicine

## 2019-06-23 ENCOUNTER — Other Ambulatory Visit: Payer: Self-pay

## 2019-06-23 DIAGNOSIS — K0889 Other specified disorders of teeth and supporting structures: Secondary | ICD-10-CM

## 2019-06-23 DIAGNOSIS — M67432 Ganglion, left wrist: Secondary | ICD-10-CM

## 2019-06-23 DIAGNOSIS — M6740 Ganglion, unspecified site: Secondary | ICD-10-CM

## 2019-06-23 MED ORDER — IBUPROFEN 800 MG PO TABS: 800.0000 mg | ORAL_TABLET | Freq: Three times a day (TID) | ORAL | 0 refills | Status: DC

## 2019-06-23 MED ORDER — AMOXICILLIN 500 MG PO CAPS: 500.0000 mg | ORAL_CAPSULE | Freq: Two times a day (BID) | ORAL | 0 refills | Status: AC

## 2019-06-23 NOTE — Discharge Instructions (Signed)
Follow up with dentistry as planned Amoxicillin twice daily for 1 week to treat infection in mouth Use anti-inflammatories for pain/swelling. You may take up to 800 mg Ibuprofen every 8 hours with food. You may supplement Ibuprofen with Tylenol [phone] mg every 8 hours.   Cyst- warm compresses, ACE wrap for compression  Follow up if developing signs of infection

## 2019-06-23 NOTE — ED Notes (Signed)
Patient able to ambulate independently  
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
## 2019-06-23 NOTE — ED Triage Notes (Signed)
Pt presents to [facility] for assessment of right dental pain starting 2 weeks ago.  States he has been having to chew more on the right side due to pain on the left.  Patient states he needs three root canals.  Patient also concerned for lump on left wrist, which he states comes and goes.  This time around it has been swollen x 1 month.

## 2019-06-24 ENCOUNTER — Telehealth: Payer: Self-pay | Admitting: Emergency Medicine

## 2019-06-24 NOTE — Telephone Encounter (Signed)
Checked in on patient, discussed medications, and encouraged return call with any continuing questions or concerns.    

## 2019-06-28 ENCOUNTER — Telehealth: Payer: Self-pay

## 2019-06-28 NOTE — Telephone Encounter (Signed)
Calvin Fletcher in office and states that Dr Jonny Ruiz approved to accept this patient as a new patient. Is this okay? CB#: [phone] or [phone]

## 2019-06-28 NOTE — Telephone Encounter (Signed)
Ok with me 

## 2019-07-05 ENCOUNTER — Ambulatory Visit: Payer: Commercial Managed Care - PPO | Admitting: Internal Medicine

## 2020-02-15 ENCOUNTER — Encounter: Payer: Self-pay | Admitting: Emergency Medicine

## 2020-02-15 ENCOUNTER — Other Ambulatory Visit: Payer: Self-pay

## 2020-02-15 ENCOUNTER — Ambulatory Visit
Admission: EM | Admit: 2020-02-15 | Discharge: 2020-02-15 | Disposition: A | Discharge status: Home / Self Care | Payer: Commercial Managed Care - PPO | Attending: Emergency Medicine | Admitting: Emergency Medicine

## 2020-02-15 DIAGNOSIS — K0381 Cracked tooth: Secondary | ICD-10-CM

## 2020-02-15 DIAGNOSIS — K089 Disorder of teeth and supporting structures, unspecified: Secondary | ICD-10-CM

## 2020-02-15 DIAGNOSIS — K0889 Other specified disorders of teeth and supporting structures: Secondary | ICD-10-CM | POA: Diagnosis not present

## 2020-02-15 MED ORDER — AMOXICILLIN 500 MG PO CAPS: 500.0000 mg | ORAL_CAPSULE | Freq: Two times a day (BID) | ORAL | 0 refills | Status: AC

## 2020-02-15 MED ORDER — NAPROXEN 500 MG PO TABS: 500.0000 mg | ORAL_TABLET | Freq: Two times a day (BID) | ORAL | 0 refills | Status: DC

## 2020-02-15 NOTE — Discharge Instructions (Signed)
[facility]:  [place] [facility] Address: [address], [place], [place], [address] Phone: [phone]  - Dr. Civils Address: [address], [place], , [address] Phone: [phone] 

## 2020-02-15 NOTE — ED Triage Notes (Addendum)
Pt presents with left sided tooth pain. States has "bad tooth" and pain flared up last night. States ibuprofen is only given minimal relief.   Pt states has hx of high BP but not on medication.

## 2020-02-15 NOTE — ED Provider Notes (Signed)
[name]    CSN: [id number] Arrival date & time: 02/15/20  0941      History   Chief Complaint Chief Complaint  Patient presents with  . Dental Pain    HPI Calvin Fletcher is a 39 y.o. male  Patient who presents with complaint of toothache. Onset of symptoms was abrupt starting 1 day ago. Patient describes pain as aching and throbbing. The pain does not radiate. Patient denies jaw swelling or fever >101. Pain is aggravated by use. Pain is alleviated by NSAIDS minimally. The patient denies other complaints. Patient has sought treatment by another care provider for this problem for contralateral side: followed by dentist, had that tooth removed. Care prior to arrival consisted of NSAID, with minimal relief.      Past Medical History:  Diagnosis Date  . Foreign body behind the eye    bb behind right eye /incident in 1991?    There are no problems to display for this patient.   Past Surgical History:  Procedure Laterality Date  . KNEE SURGERY         Home Medications    Prior to Admission medications   Medication Sig Start Date End Date Taking? Authorizing Provider  amoxicillin (AMOXIL) 500 MG capsule Take 1 capsule (500 mg total) by mouth 2 (two) times daily for 7 days. 02/15/20 02/22/20  Hall-Potvin, [place], PA-C  ibuprofen (ADVIL) 800 MG tablet Take 1 tablet (800 mg total) by mouth 3 (three) times daily. 06/23/19   Wieters, Hallie C, PA-C  naproxen (NAPROSYN) 500 MG tablet Take 1 tablet (500 mg total) by mouth 2 (two) times daily. 02/15/20   Hall-Potvin, [place], PA-C  triamcinolone cream (KENALOG) 0.1 % Apply 1 application topically 2 (two) times daily. 06/18/18   Wurst, [place], PA-C  famotidine (PEPCID) 20 MG tablet Take 1 tablet (20 mg total) by mouth 2 (two) times daily. Take first thing in the morning and before supper in the evening. 05/29/17 06/23/19  Ofilia Neas, PA-C    Family History History reviewed. No pertinent family  history.  Social History Social History   Tobacco Use  . Smoking status: Never Smoker  . Smokeless tobacco: Never Used  Substance Use Topics  . Alcohol use: Yes  . Drug use: No     Allergies   Patient has no known allergies.   Review of Systems Review of Systems  Constitutional: Negative for fatigue and fever.  HENT: Positive for dental problem. Negative for drooling, trouble swallowing and voice change.   Respiratory: Negative for cough and shortness of breath.   Cardiovascular: Negative for chest pain and palpitations.  Gastrointestinal: Negative for abdominal pain, diarrhea and vomiting.  Musculoskeletal: Negative for arthralgias and myalgias.  Skin: Negative for rash and wound.  Neurological: Negative for speech difficulty and headaches.  All other systems reviewed and are negative.    Physical Exam Triage Vital Signs ED Triage Vitals  Enc Vitals Group     BP 02/15/20 0955 (!) 154/109     Pulse Rate 02/15/20 0955 79     Resp 02/15/20 0955 17     Temp 02/15/20 0955 98.4 F (36.9 C)     Temp Source 02/15/20 0955 Oral     SpO2 02/15/20 0955 96 %     Weight --      Height --      Head Circumference --      Peak Flow --      Pain Score 02/15/20 0954 5  Pain Loc --      Pain Edu? --      Excl. in GC? --    No data found.  Updated Vital Signs BP (!) 154/109 (BP Location: Left Arm)   Pulse 79   Temp 98.4 F (36.9 C) (Oral)   Resp 17   SpO2 96%   Visual Acuity Right Eye Distance:   Left Eye Distance:   Bilateral Distance:    Right Eye Near:   Left Eye Near:    Bilateral Near:     Physical Exam Constitutional:      General: He is not in acute distress. HENT:     Head: Normocephalic and atraumatic.     Mouth/Throat:     Mouth: Mucous membranes are moist.     Comments: Poor dentition.  Left upper front molar with significant decay, cracked.  Surrounding gingival edema with tenderness.  No discharge. Eyes:     General: No scleral icterus.     Pupils: Pupils are equal, round, and reactive to light.  Cardiovascular:     Rate and Rhythm: Normal rate.  Pulmonary:     Effort: Pulmonary effort is normal. No respiratory distress.     Breath sounds: No wheezing.  Musculoskeletal:     Cervical back: Tenderness present.  Lymphadenopathy:     Cervical: Cervical adenopathy present.  Skin:    Coloration: Skin is not jaundiced or pale.  Neurological:     Mental Status: He is alert and oriented to person, place, and time.      UC Treatments / Results  Labs (all labs ordered are listed, but only abnormal results are displayed) Labs Reviewed - No data to display  EKG   Radiology No results found.  Procedures Procedures (including critical care time)  Medications Ordered in UC Medications - No data to display  Initial Impression / Assessment and Plan / UC Course  I have reviewed the triage vital signs and the nursing notes.  Pertinent labs & imaging results that were available during my care of the patient were reviewed by me and considered in my medical decision making (see chart for details).     Afebrile, nontoxic, no airway compromise.  Will cover for infectious process with amoxicillin, have patient follow-up with dentist in 1 week.  Return precautions discussed, pt verbalized understanding and is agreeable to plan. Final Clinical Impressions(s) / UC Diagnoses   Final diagnoses:  Pain, dental  Nontraumatic broken or cracked tooth  Poor dentition     Discharge Instructions     Low-Cost Community Dental Resources:  [facility] Address: [address], [place], [place], [address] Phone: [phone]  - Dr. Lawrence Marseilles Address: [address], [place], [place], [address] Phone: [phone]    ED Prescriptions    Medication Sig Dispense Auth. Provider   amoxicillin (AMOXIL) 500 MG capsule Take 1 capsule (500 mg total) by mouth 2 (two) times daily for 7 days. 14 capsule  Hall-Potvin, [place], PA-C   naproxen (NAPROSYN) 500 MG tablet Take 1 tablet (500 mg total) by mouth 2 (two) times daily. 30 tablet Hall-Potvin, [place], PA-C     I have reviewed the PDMP during this encounter.   Hall-Potvin, [place], [place] 02/15/20 1028

## 2020-03-20 ENCOUNTER — Ambulatory Visit
Admission: EM | Admit: 2020-03-20 | Discharge: 2020-03-20 | Disposition: A | Discharge status: Home / Self Care | Payer: Commercial Managed Care - PPO | Attending: Emergency Medicine | Admitting: Emergency Medicine

## 2020-03-20 ENCOUNTER — Encounter: Payer: Self-pay | Admitting: *Deleted

## 2020-03-20 DIAGNOSIS — J019 Acute sinusitis, unspecified: Secondary | ICD-10-CM | POA: Diagnosis not present

## 2020-03-20 DIAGNOSIS — Z1152 Encounter for screening for COVID-19: Secondary | ICD-10-CM

## 2020-03-20 MED ORDER — AMOXICILLIN-POT CLAVULANATE 875-125 MG PO TABS: 1.0000 | ORAL_TABLET | Freq: Two times a day (BID) | ORAL | 0 refills | Status: AC

## 2020-03-20 MED ORDER — FLUTICASONE PROPIONATE 50 MCG/ACT NA SUSP: 1.0000 | Freq: Every day | NASAL | 0 refills | Status: AC

## 2020-03-20 NOTE — ED Triage Notes (Signed)
Patient reports nasal congestion since Wednesday. This am bodyaches. No fever that he is aware of. States he thought he had sinus infection.   Would like COVID testing, no exposure that he is aware of.

## 2020-03-20 NOTE — ED Provider Notes (Signed)
[name]    CSN: [id number] Arrival date & time: 03/20/20  5916      History   Chief Complaint Chief Complaint  Patient presents with  . Generalized Body Aches  . Nasal Congestion    HPI Calvin Fletcher is a 39 y.o. male presenting today for evaluation of URI symptoms. Symptoms began last Friday, but worsened on wednesday. Pressure and burning in sinuses. Right sided chest discomfort last week, but denies currently.  Denies any cough shortness of breath or chest pain.  Took nyquil cold and sinus. Today worsened. Fevers last week.   HPI  Past Medical History:  Diagnosis Date  . Foreign body behind the eye    bb behind right eye /incident in 1991?    There are no problems to display for this patient.   Past Surgical History:  Procedure Laterality Date  . KNEE SURGERY         Home Medications    Prior to Admission medications   Medication Sig Start Date End Date Taking? Authorizing Provider  amoxicillin-clavulanate (AUGMENTIN) 875-125 MG tablet Take 1 tablet by mouth every 12 (twelve) hours for 7 days. 03/20/20 03/27/20  Wieters, Hallie C, PA-C  fluticasone (FLONASE) 50 MCG/ACT nasal spray Place 1-2 sprays into both nostrils daily. 03/20/20   Wieters, Hallie C, PA-C  triamcinolone cream (KENALOG) 0.1 % Apply 1 application topically 2 (two) times daily. 06/18/18   Wurst, [place], PA-C  famotidine (PEPCID) 20 MG tablet Take 1 tablet (20 mg total) by mouth 2 (two) times daily. Take first thing in the morning and before supper in the evening. 05/29/17 06/23/19  Ofilia Neas, PA-C    Family History History reviewed. No pertinent family history.  Social History Social History   Tobacco Use  . Smoking status: Never Smoker  . Smokeless tobacco: Never Used  Substance Use Topics  . Alcohol use: Yes  . Drug use: No     Allergies   Patient has no known allergies.   Review of Systems Review of Systems  Constitutional: Negative for activity change,  appetite change, chills, fatigue and fever.  HENT: Positive for congestion and rhinorrhea. Negative for ear pain, sinus pressure, sore throat and trouble swallowing.   Eyes: Negative for discharge and redness.  Respiratory: Negative for cough, chest tightness and shortness of breath.   Cardiovascular: Negative for chest pain.  Gastrointestinal: Negative for abdominal pain, diarrhea, nausea and vomiting.  Musculoskeletal: Positive for myalgias.  Skin: Negative for rash.  Neurological: Negative for dizziness, light-headedness and headaches.     Physical Exam Triage Vital Signs ED Triage Vitals  Enc Vitals Group     BP      Pulse      Resp      Temp      Temp src      SpO2      Weight      Height      Head Circumference      Peak Flow      Pain Score      Pain Loc      Pain Edu?      Excl. in GC?    No data found.  Updated Vital Signs BP (!) 179/108 (BP Location: Right Arm)   Pulse 71   Temp 98.1 F (36.7 C) (Oral)   Resp 16   SpO2 97%   Visual Acuity Right Eye Distance:   Left Eye Distance:   Bilateral Distance:    Right  Eye Near:   Left Eye Near:    Bilateral Near:     Physical Exam Vitals and nursing note reviewed.  Constitutional:      Appearance: He is well-developed.     Comments: No acute distress  HENT:     Head: Normocephalic and atraumatic.     Ears:     Comments: Bilateral ears without tenderness to palpation of external auricle, tragus and mastoid, EAC's without erythema or swelling, TM's with good bony landmarks and cone of light. Non erythematous.     Nose: Nose normal.     Mouth/Throat:     Comments: Oral mucosa pink and moist, no tonsillar enlargement or exudate. Posterior pharynx patent and nonerythematous, no uvula deviation or swelling. Normal phonation. Eyes:     Conjunctiva/sclera: Conjunctivae normal.  Cardiovascular:     Rate and Rhythm: Normal rate and regular rhythm.  Pulmonary:     Effort: Pulmonary effort is normal. No  respiratory distress.     Comments: Breathing comfortably at rest, CTABL, no wheezing, rales or other adventitious sounds auscultated Abdominal:     General: There is no distension.  Musculoskeletal:        General: Normal range of motion.     Cervical back: Neck supple.  Skin:    General: Skin is warm and dry.  Neurological:     Mental Status: He is alert and oriented to person, place, and time.      UC Treatments / Results  Labs (all labs ordered are listed, but only abnormal results are displayed) Labs Reviewed  NOVEL CORONAVIRUS, NAA    EKG   Radiology No results found.  Procedures Procedures (including critical care time)  Medications Ordered in UC Medications - No data to display  Initial Impression / Assessment and Plan / UC Course  I have reviewed the triage vital signs and the nursing notes.  Pertinent labs & imaging results that were available during my care of the patient were reviewed by me and considered in my medical decision making (see chart for details).     Covid test pending for screening, your symptoms greater than 1 week, treating with Augmentin for sinusitis, Flonase and continue symptomatic and supportive care for rest and fluids.  Discussed strict return precautions. Patient verbalized understanding and is agreeable with plan.  Final Clinical Impressions(s) / UC Diagnoses   Final diagnoses:  Encounter for screening for COVID-19  Acute sinusitis with symptoms > 10 days     Discharge Instructions     Augmentin twice daily for 1 week for sinus infection Flonase nasal spray 1-2 spray each nostril May supplement with zyrtec/claritin, mucinex Rest and fluids Tylenol and Ibuprofen COVID test pending- check my chart for results, may return to work if results negative     ED Prescriptions    Medication Sig Dispense Auth. Provider   amoxicillin-clavulanate (AUGMENTIN) 875-125 MG tablet Take 1 tablet by mouth every 12 (twelve) hours for  7 days. 14 tablet Wieters, Hallie C, PA-C   fluticasone (FLONASE) 50 MCG/ACT nasal spray Place 1-2 sprays into both nostrils daily. 16 g Wieters, [place] C, PA-C     PDMP not reviewed this encounter.   Lew Dawes, [place] 03/20/20 [phone]

## 2020-03-20 NOTE — Discharge Instructions (Signed)
Augmentin twice daily for 1 week for sinus infection Flonase nasal spray 1-2 spray each nostril May supplement with zyrtec/claritin, mucinex Rest and fluids Tylenol and Ibuprofen COVID test pending- check my chart for results, may return to work if results negative

## 2020-03-21 LAB — SARS-COV-2, NAA 2 DAY TAT

## 2020-03-21 LAB — NOVEL CORONAVIRUS, NAA: SARS-CoV-2, NAA: NOT DETECTED

## 2020-04-22 ENCOUNTER — Other Ambulatory Visit: Payer: Self-pay

## 2020-04-22 ENCOUNTER — Ambulatory Visit
Admission: EM | Admit: 2020-04-22 | Discharge: 2020-04-22 | Disposition: A | Discharge status: Home / Self Care | Payer: Commercial Managed Care - PPO | Attending: Emergency Medicine | Admitting: Emergency Medicine

## 2020-04-22 ENCOUNTER — Encounter: Payer: Self-pay | Admitting: Emergency Medicine

## 2020-04-22 DIAGNOSIS — L089 Local infection of the skin and subcutaneous tissue, unspecified: Secondary | ICD-10-CM | POA: Diagnosis not present

## 2020-04-22 DIAGNOSIS — W57XXXA Bitten or stung by nonvenomous insect and other nonvenomous arthropods, initial encounter: Secondary | ICD-10-CM

## 2020-04-22 DIAGNOSIS — T07XXXA Unspecified multiple injuries, initial encounter: Secondary | ICD-10-CM

## 2020-04-22 MED ORDER — TRIAMCINOLONE ACETONIDE 0.1 % EX CREA: 1.0000 "application " | TOPICAL_CREAM | Freq: Two times a day (BID) | CUTANEOUS | 0 refills | Status: AC

## 2020-04-22 MED ORDER — CEFTRIAXONE SODIUM 1 G IJ SOLR
1.0000 g | Freq: Once | INTRAMUSCULAR | Status: AC
  Administered 2020-04-22: 1 g via INTRAMUSCULAR

## 2020-04-22 MED ORDER — DOXYCYCLINE HYCLATE 100 MG PO CAPS: 100.0000 mg | ORAL_CAPSULE | Freq: Two times a day (BID) | ORAL | 0 refills | Status: AC

## 2020-04-22 NOTE — Discharge Instructions (Addendum)
We gave you a shot of Rocephin Continue with doxycycline twice daily for the next 10 days Triamcinolone to help with localized swelling inflammation May use over-the-counter antihistamine such as daily cetirizine/Zyrtec or loratadine/Claritin, supple with Benadryl to further help with itching  Follow-up if any symptoms not improving or worsening

## 2020-04-22 NOTE — ED Provider Notes (Signed)
[name]    CSN: [id number] Arrival date & time: 04/22/20  1253      History   Chief Complaint Chief Complaint  Patient presents with  . Rash    HPI Calvin Fletcher is a 40 y.o. male presenting today for evaluation of a rash.  Patient reports that he has had a rash to the left side of his body arms chest and head.  Associated with itching and swelling.  Began yesterday.  Denies any fevers dizziness or lightheadedness.  Has had a lot of itching to areas, some pain to lesion on chest.  Expresses concern about affordability of medicines and requesting shot if possible today.  HPI  Past Medical History:  Diagnosis Date  . Foreign body behind the eye    bb behind right eye /incident in 1991?    There are no problems to display for this patient.   Past Surgical History:  Procedure Laterality Date  . KNEE SURGERY         Home Medications    Prior to Admission medications   Medication Sig Start Date End Date Taking? Authorizing Provider  doxycycline (VIBRAMYCIN) 100 MG capsule Take 1 capsule (100 mg total) by mouth 2 (two) times daily for 10 days. 04/22/20 05/02/20 Yes Rayleen Wyrick C, PA-C  triamcinolone (KENALOG) 0.1 % Apply 1 application topically 2 (two) times daily. 04/22/20  Yes Oney Tatlock C, PA-C  fluticasone (FLONASE) 50 MCG/ACT nasal spray Place 1-2 sprays into both nostrils daily. 03/20/20   Tyquon Near C, PA-C  famotidine (PEPCID) 20 MG tablet Take 1 tablet (20 mg total) by mouth 2 (two) times daily. Take first thing in the morning and before supper in the evening. 05/29/17 06/23/19  Ofilia Neas, PA-C    Family History History reviewed. No pertinent family history.  Social History Social History   Tobacco Use  . Smoking status: Never Smoker  . Smokeless tobacco: Never Used  Substance Use Topics  . Alcohol use: Yes  . Drug use: No     Allergies   Patient has no known allergies.   Review of Systems Review of Systems   Constitutional: Negative for fatigue and fever.  Eyes: Negative for redness, itching and visual disturbance.  Respiratory: Negative for shortness of breath.   Cardiovascular: Negative for chest pain and leg swelling.  Gastrointestinal: Negative for nausea and vomiting.  Musculoskeletal: Negative for arthralgias and myalgias.  Skin: Positive for color change and rash. Negative for wound.  Neurological: Negative for dizziness, syncope, weakness, light-headedness and headaches.     Physical Exam Triage Vital Signs ED Triage Vitals  Enc Vitals Group     BP 04/22/20 1436 (!) 174/91     Pulse Rate 04/22/20 1436 81     Resp 04/22/20 1436 18     Temp 04/22/20 1436 98 F (36.7 C)     Temp Source 04/22/20 1436 Oral     SpO2 04/22/20 1436 98 %     Weight --      Height --      Head Circumference --      Peak Flow --      Pain Score 04/22/20 1437 0     Pain Loc --      Pain Edu? --      Excl. in GC? --    No data found.  Updated Vital Signs BP (!) 174/91 (BP Location: Left Arm)   Pulse 81   Temp 98 F (36.7 C) (Oral)  Resp 18   SpO2 98%   Visual Acuity Right Eye Distance:   Left Eye Distance:   Bilateral Distance:    Right Eye Near:   Left Eye Near:    Bilateral Near:     Physical Exam Vitals and nursing note reviewed.  Constitutional:      Appearance: He is well-developed and well-nourished.     Comments: No acute distress  HENT:     Head: Normocephalic and atraumatic.     Nose: Nose normal.  Eyes:     Conjunctiva/sclera: Conjunctivae normal.  Cardiovascular:     Rate and Rhythm: Normal rate.  Pulmonary:     Effort: Pulmonary effort is normal. No respiratory distress.  Abdominal:     General: There is no distension.  Musculoskeletal:        General: Normal range of motion.     Cervical back: Neck supple.  Skin:    General: Skin is warm and dry.     Comments: Multiple scabbed lesions noted to scalp, left ear, left chest, left arm with evidence of  excoriation, various lesions with surrounding erythema slight warmth and swelling, some lesions oozing serous fluid  Neurological:     Mental Status: He is alert and oriented to person, place, and time.  Psychiatric:        Mood and Affect: Mood and affect normal.      UC Treatments / Results  Labs (all labs ordered are listed, but only abnormal results are displayed) Labs Reviewed - No data to display  EKG   Radiology No results found.  Procedures Procedures (including critical care time)  Medications Ordered in UC Medications  cefTRIAXone (ROCEPHIN) injection 1 g (1 g Intramuscular Given 04/22/20 1520)    Initial Impression / Assessment and Plan / UC Course  I have reviewed the triage vital signs and the nursing notes.  Pertinent labs & imaging results that were available during my care of the patient were reviewed by me and considered in my medical decision making (see chart for details).     Opting to treat for infection with doxycycline, giving Rocephin prior to discharge given possible delay in obtaining oral antibiotics, triamcinolone topically to help with localized inflammation.  Monitor for gradual healing of these wounds.  Discussed strict return precautions. Patient verbalized understanding and is agreeable with plan.  Final Clinical Impressions(s) / UC Diagnoses   Final diagnoses:  Insect bites of multiple sites, infected     Discharge Instructions     We gave you a shot of Rocephin Continue with doxycycline twice daily for the next 10 days Triamcinolone to help with localized swelling inflammation May use over-the-counter antihistamine such as daily cetirizine/Zyrtec or loratadine/Claritin, supple with Benadryl to further help with itching  Follow-up if any symptoms not improving or worsening    ED Prescriptions    Medication Sig Dispense Auth. Provider   doxycycline (VIBRAMYCIN) 100 MG capsule Take 1 capsule (100 mg total) by mouth 2 (two) times  daily for 10 days. 20 capsule Marwin Primmer C, PA-C   triamcinolone (KENALOG) 0.1 % Apply 1 application topically 2 (two) times daily. 80 g Alarik Radu, [place] C, PA-C     PDMP not reviewed this encounter.   Lew Dawes, [place] 04/22/20 1539

## 2020-04-22 NOTE — ED Triage Notes (Signed)
Pt here for rash to left side of body or arms, chest, ear and head that is itching and swollen on top of his head; pt sts noticed yesterday

## 2022-08-20 ENCOUNTER — Ambulatory Visit
Admission: EM | Admit: 2022-08-20 | Discharge: 2022-08-20 | Disposition: A | Discharge status: Home / Self Care | Payer: Commercial Managed Care - PPO | Attending: Internal Medicine | Admitting: Internal Medicine

## 2022-08-20 DIAGNOSIS — S0993XA Unspecified injury of face, initial encounter: Secondary | ICD-10-CM

## 2022-08-20 MED ORDER — LIDOCAINE VISCOUS HCL 2 % MT SOLN: 15.0000 mL | Freq: Four times a day (QID) | OROMUCOSAL | 0 refills | Status: AC | PRN

## 2022-08-20 NOTE — ED Provider Notes (Signed)
[name]    CSN: [id number] Arrival date & time: 08/20/22  1238      History   Chief Complaint No chief complaint on file.   HPI Calvin Fletcher is a 42 y.o. male.   Patient presents to urgent care for evaluation of pain to the right posterior tongue after he "bit off a piece of his tongue" while eating steak on 3 days ago on Saturday Aug 17, 2022. He was drinking alcohol at the time of the injury and states he accidentally bit his tongue against his right back tooth.  He did not feel much pain in the moment as he had had a lot of alcohol to drink.  Injury bled a little bit initially but bleeding stopped quickly.  He is now complaining of pain with eating and tongue movement. States he has had minimal amount to eat since injury happened and has been eating on the left side of his mouth due to pain. Denies history of diabetes and immunosuppression. States he has been drinking alcohol at night to cope with the pain without much relief.      Past Medical History:  Diagnosis Date   Foreign body behind the eye    bb behind right eye /incident in 1991?    There are no problems to display for this patient.   Past Surgical History:  Procedure Laterality Date   KNEE SURGERY         Home Medications    Prior to Admission medications   Medication Sig Start Date End Date Taking? Authorizing Provider  lidocaine (XYLOCAINE) 2 % solution Use as directed 15 mLs in the mouth or throat every 6 (six) hours as needed for mouth pain. 08/20/22  Yes Carlisle Beers, FNP  fluticasone (FLONASE) 50 MCG/ACT nasal spray Place 1-2 sprays into both nostrils daily. 03/20/20   Wieters, Hallie C, PA-C  triamcinolone (KENALOG) 0.1 % Apply 1 application topically 2 (two) times daily. 04/22/20   Wieters, Hallie C, PA-C  famotidine (PEPCID) 20 MG tablet Take 1 tablet (20 mg total) by mouth 2 (two) times daily. Take first thing in the morning and before supper in the evening. 05/29/17  06/23/19  Ofilia Neas, PA-C    Family History History reviewed. No pertinent family history.  Social History Social History   Tobacco Use   Smoking status: Never   Smokeless tobacco: Never  Substance Use Topics   Alcohol use: Yes   Drug use: Yes    Types: Marijuana     Allergies   Patient has no known allergies.   Review of Systems Review of Systems Per HPI  Physical Exam Triage Vital Signs ED Triage Vitals  Enc Vitals Group     BP 08/20/22 1249 130/85     Pulse Rate 08/20/22 1249 93     Resp 08/20/22 1249 18     Temp 08/20/22 1249 98.1 F (36.7 C)     Temp Source 08/20/22 1249 Oral     SpO2 08/20/22 1249 94 %     Weight --      Height --      Head Circumference --      Peak Flow --      Pain Score 08/20/22 1251 5     Pain Loc --      Pain Edu? --      Excl. in GC? --    No data found.  Updated Vital Signs BP 130/85 (BP Location: Left  Arm)   Pulse 93   Temp 98.1 F (36.7 C) (Oral)   Resp 18   SpO2 94%   Visual Acuity Right Eye Distance:   Left Eye Distance:   Bilateral Distance:    Right Eye Near:   Left Eye Near:    Bilateral Near:     Physical Exam Vitals and nursing note reviewed.  Constitutional:      Appearance: He is not ill-appearing or toxic-appearing.  HENT:     Head: Normocephalic and atraumatic.     Right Ear: Hearing and external ear normal.     Left Ear: Hearing and external ear normal.     Nose: Nose normal.     Mouth/Throat:     Lips: Pink.     Mouth: Mucous membranes are moist. Injury present.     Tongue: Tongue does not deviate from midline.     Palate: No mass and lesions.     Pharynx: Oropharynx is clear. Uvula midline. No pharyngeal swelling, oropharyngeal exudate, posterior oropharyngeal erythema or uvula swelling.     Tonsils: No tonsillar exudate or tonsillar abscesses.   Eyes:     General: Lids are normal. Vision grossly intact. Gaze aligned appropriately.     Extraocular Movements: Extraocular movements  intact.     Conjunctiva/sclera: Conjunctivae normal.  Pulmonary:     Effort: Pulmonary effort is normal.  Musculoskeletal:     Cervical back: Normal range of motion and neck supple. No tenderness.  Lymphadenopathy:     Cervical: No cervical adenopathy.  Skin:    General: Skin is warm and dry.     Capillary Refill: Capillary refill takes less than 2 seconds.     Findings: No rash.  Neurological:     General: No focal deficit present.     Mental Status: He is alert and oriented to person, place, and time. Mental status is at baseline.     Cranial Nerves: No dysarthria or facial asymmetry.  Psychiatric:        Mood and Affect: Mood normal.        Speech: Speech normal.        Behavior: Behavior normal.        Thought Content: Thought content normal.        Judgment: Judgment normal.        UC Treatments / Results  Labs (all labs ordered are listed, but only abnormal results are displayed) Labs Reviewed - No data to display  EKG   Radiology No results found.  Procedures Procedures (including critical care time)  Medications Ordered in UC Medications - No data to display  Initial Impression / Assessment and Plan / UC Course  I have reviewed the triage vital signs and the nursing notes.  Pertinent labs & imaging results that were available during my care of the patient were reviewed by me and considered in my medical decision making (see chart for details).   1. Injury of tongue Tongue injury will likely heal quickly. May eat and drink soft foods to avoid irritation to area. May purchase dental wax to place over tooth to avoid further tongue irritation and pain.  Viscous lidocaine sent to pharmacy to be used every 6 hours as needed for pain to the tongue.  Encouraged to schedule an appointment with dentist for soon as possible for follow-up to ensure tongue is healing appropriately.  No signs of infection or indication for antibiotic therapy today.  May use ibuprofen  every 6 hours as needed  for pain.   Discussed physical exam and available lab work findings in clinic with patient.  Counseled patient regarding appropriate use of medications and potential side effects for all medications recommended or prescribed today. Discussed red flag signs and symptoms of worsening condition,when to call the PCP office, return to urgent care, and when to seek higher level of care in the emergency department. Patient verbalizes understanding and agreement with plan. All questions answered. Patient discharged in stable condition.    Final Clinical Impressions(s) / UC Diagnoses   Final diagnoses:  Injury of tongue, initial encounter     Discharge Instructions      Your tongue injury will heal over the next few weeks. Schedule an appointment with your dentist for follow-up. You may purchase dental wax to place over your tooth to prevent the tooth from rubbing the sore area on your tongue. Use lidocaine every 6 hours as needed for pain to the tongue. Take ibuprofen 600mg  every 6 hours as needed for pain. Eat soft foods like applesauce, jello, smoothies, etc.  Return as needed.     ED Prescriptions     Medication Sig Dispense Auth. Provider   lidocaine (XYLOCAINE) 2 % solution Use as directed 15 mLs in the mouth or throat every 6 (six) hours as needed for mouth pain. 100 mL Carlisle Beers, FNP      PDMP not reviewed this encounter.   Carlisle Beers, [place] 08/20/22 1319

## 2022-08-20 NOTE — Discharge Instructions (Addendum)
Your tongue injury will heal over the next few weeks. Schedule an appointment with your dentist for follow-up. You may purchase dental wax to place over your tooth to prevent the tooth from rubbing the sore area on your tongue. Use lidocaine every 6 hours as needed for pain to the tongue. Take ibuprofen 600mg  every 6 hours as needed for pain. Eat soft foods like applesauce, jello, smoothies, etc.  Return as needed.

## 2022-08-20 NOTE — ED Triage Notes (Signed)
Pt reports he has a chipped a piece of his tongue on his chipped tooth x 3 days. Hurts to eat and drink because it is hard to swallow.

## 2023-01-23 ENCOUNTER — Other Ambulatory Visit: Payer: Self-pay

## 2023-01-23 ENCOUNTER — Ambulatory Visit
Admission: EM | Admit: 2023-01-23 | Discharge: 2023-01-23 | Disposition: A | Discharge status: Home / Self Care | Payer: Medicaid Other | Attending: Family Medicine | Admitting: Family Medicine

## 2023-01-23 DIAGNOSIS — R030 Elevated blood-pressure reading, without diagnosis of hypertension: Secondary | ICD-10-CM

## 2023-01-23 NOTE — ED Triage Notes (Signed)
C/o hypertension, was getting fit tested and bp was high, was sent for clearance.

## 2023-01-23 NOTE — Discharge Instructions (Addendum)
Advised patient to check blood pressure in the morning prior to eating for the next 10 days and to log blood pressure measurement values so that PCP may better understand daily blood pressure trends.  Advised patient that we will follow-up with him tomorrow Friday, 01/24/2023 once results are received.  Advised if symptoms worsen and/or unresolved please go to nearest emergency department for further evaluation.

## 2023-01-23 NOTE — ED Provider Notes (Signed)
[facility]    CSN: [id number] Arrival date & time: 01/23/23  1252      History   Chief Complaint Chief Complaint  Patient presents with   Hypertension    HPI Calvin Fletcher is a 42 y.o. male.   HPI Pleasant 42 year old male presents with elevated blood pressure during FIT testing.  Blood pressure was too high and was sent here for clearance.  Initial BP in triage was 168/119, then manual was 160/110.  Past Medical History:  Diagnosis Date   Foreign body behind the eye    bb behind right eye /incident in 1991?    There are no problems to display for this patient.   Past Surgical History:  Procedure Laterality Date   KNEE SURGERY         Home Medications    Prior to Admission medications   Medication Sig Start Date End Date Taking? Authorizing Provider  fluticasone (FLONASE) 50 MCG/ACT nasal spray Place 1-2 sprays into both nostrils daily. 03/20/20   Wieters, Hallie C, PA-C  lidocaine (XYLOCAINE) 2 % solution Use as directed 15 mLs in the mouth or throat every 6 (six) hours as needed for mouth pain. 08/20/22   Carlisle Beers, FNP  triamcinolone (KENALOG) 0.1 % Apply 1 application topically 2 (two) times daily. 04/22/20   Wieters, Hallie C, PA-C  famotidine (PEPCID) 20 MG tablet Take 1 tablet (20 mg total) by mouth 2 (two) times daily. Take first thing in the morning and before supper in the evening. 05/29/17 06/23/19  Ofilia Neas, PA-C    Family History History reviewed. No pertinent family history.  Social History Social History   Tobacco Use   Smoking status: Never   Smokeless tobacco: Never  Substance Use Topics   Alcohol use: Yes   Drug use: Yes    Types: Marijuana     Allergies   Patient has no known allergies.   Review of Systems Review of Systems   Physical Exam Triage Vital Signs ED Triage Vitals  Encounter Vitals Group     BP 01/23/23 1259 (!) 168/119     Systolic BP Percentile --      Diastolic BP Percentile --       Pulse Rate 01/23/23 1259 85     Resp 01/23/23 1259 16     Temp 01/23/23 1259 98.5 F (36.9 C)     Temp src --      SpO2 01/23/23 1259 99 %     Weight --      Height --      Head Circumference --      Peak Flow --      Pain Score 01/23/23 1301 0     Pain Loc --      Pain Education --      Exclude from Growth Chart --    No data found.  Updated Vital Signs BP (!) 160/110 (BP Location: Left Arm)   Pulse 85   Temp 98.5 F (36.9 C)   Resp 16   SpO2 99%       Physical Exam Vitals and nursing note reviewed.  Constitutional:      Appearance: Normal appearance. He is normal weight.  HENT:     Head: Normocephalic and atraumatic.     Right Ear: Tympanic membrane, ear canal and external ear normal.     Left Ear: Tympanic membrane, ear canal and external ear normal.     Mouth/Throat:  Mouth: Mucous membranes are moist.     Pharynx: Oropharynx is clear.  Eyes:     Extraocular Movements: Extraocular movements intact.     Conjunctiva/sclera: Conjunctivae normal.     Pupils: Pupils are equal, round, and reactive to light.  Cardiovascular:     Rate and Rhythm: Normal rate and regular rhythm.     Pulses: Normal pulses.     Heart sounds: Normal heart sounds.     Comments: Hypertensive Pulmonary:     Effort: Pulmonary effort is normal.     Breath sounds: Normal breath sounds. No wheezing, rhonchi or rales.  Musculoskeletal:        General: Normal range of motion.     Cervical back: Normal range of motion and neck supple.  Skin:    General: Skin is warm and dry.  Neurological:     General: No focal deficit present.     Mental Status: He is alert and oriented to person, place, and time. Mental status is at baseline.  Psychiatric:        Mood and Affect: Mood normal.        Behavior: Behavior normal.      UC Treatments / Results  Labs (all labs ordered are listed, but only abnormal results are displayed) Labs Reviewed  COMPREHENSIVE METABOLIC PANEL  CBC WITH  DIFFERENTIAL/PLATELET    EKG   Radiology No results found.  Procedures Procedures (including critical care time)  Medications Ordered in UC Medications - No data to display  Initial Impression / Assessment and Plan / UC Course  I have reviewed the triage vital signs and the nursing notes.  Pertinent labs & imaging results that were available during my care of the patient were reviewed by me and considered in my medical decision making (see chart for details).     MDM: 1.  Elevated blood pressure reading without diagnosis of hypertension-CBC with differential, CMP ordered.  Patient reports has no health insurance and is dependent on getting this job with [employer].  Patient is currently not followed by PCP.  Advised patient to check blood pressure in the morning prior to eating for the next 10 days and to log blood pressure measurement values so that PCP may better understand daily blood pressure trends. Advised patient to check blood pressure in the morning prior to eating for the next 10 days and to log blood pressure measurement values so that PCP may better understand daily blood pressure trends.  Advised patient that we will follow-up with him tomorrow Friday, 01/24/2023 once results are received.  Advised if symptoms worsen and/or unresolved please go to nearest emergency department for further evaluation.  Final Clinical Impressions(s) / UC Diagnoses   Final diagnoses:  Elevated blood-pressure reading without diagnosis of hypertension     Discharge Instructions      Advised patient to check blood pressure in the morning prior to eating for the next 10 days and to log blood pressure measurement values so that PCP may better understand daily blood pressure trends.  Advised patient that we will follow-up with him tomorrow Friday, 01/24/2023 once results are received.  Advised if symptoms worsen and/or unresolved please go to nearest emergency department for further  evaluation.     ED Prescriptions   None    PDMP not reviewed this encounter.   Trevor Iha, FNP 01/23/23 1400

## 2023-01-24 ENCOUNTER — Telehealth: Payer: Self-pay

## 2023-01-24 ENCOUNTER — Telehealth: Payer: Self-pay | Admitting: Family Medicine

## 2023-01-24 LAB — CBC WITH DIFFERENTIAL/PLATELET
Basophils Absolute: 0.1 10*3/uL (ref 0.0–0.2)
Basos: 1 %
EOS (ABSOLUTE): 0 10*3/uL (ref 0.0–0.4)
Eos: 0 %
Hematocrit: 48.2 % (ref 37.5–51.0)
Hemoglobin: 16.5 g/dL (ref 13.0–17.7)
Immature Grans (Abs): 0 10*3/uL (ref 0.0–0.1)
Immature Granulocytes: 0 %
Lymphocytes Absolute: 2.9 10*3/uL (ref 0.7–3.1)
Lymphs: 34 %
MCH: 32.6 pg (ref 26.6–33.0)
MCHC: 34.2 g/dL (ref 31.5–35.7)
MCV: 95 fL (ref 79–97)
Monocytes Absolute: 0.6 10*3/uL (ref 0.1–0.9)
Monocytes: 7 %
Neutrophils Absolute: 5 10*3/uL (ref 1.4–7.0)
Neutrophils: 58 %
Platelets: 229 10*3/uL (ref 150–450)
RBC: 5.06 x10E6/uL (ref 4.14–5.80)
RDW: 11.4 % — ABNORMAL LOW (ref 11.6–15.4)
WBC: 8.7 10*3/uL (ref 3.4–10.8)

## 2023-01-24 LAB — COMPREHENSIVE METABOLIC PANEL
ALT: 34 [IU]/L (ref 0–44)
AST: 45 [IU]/L — ABNORMAL HIGH (ref 0–40)
Albumin: 5.1 g/dL (ref 4.1–5.1)
Alkaline Phosphatase: 77 [IU]/L (ref 44–121)
BUN/Creatinine Ratio: 7 — ABNORMAL LOW (ref 9–20)
BUN: 5 mg/dL — ABNORMAL LOW (ref 6–24)
Bilirubin Total: 0.3 mg/dL (ref 0.0–1.2)
CO2: 21 mmol/L (ref 20–29)
Calcium: 9.8 mg/dL (ref 8.7–10.2)
Chloride: 104 mmol/L (ref 96–106)
Creatinine, Ser: 0.73 mg/dL — ABNORMAL LOW (ref 0.76–1.27)
Globulin, Total: 3.2 g/dL (ref 1.5–4.5)
Glucose: 93 mg/dL (ref 70–99)
Potassium: 4.9 mmol/L (ref 3.5–5.2)
Sodium: 145 mmol/L — ABNORMAL HIGH (ref 134–144)
Total Protein: 8.3 g/dL (ref 6.0–8.5)
eGFR: 118 mL/min/{1.73_m2} (ref >59)

## 2023-01-24 MED ORDER — LOSARTAN POTASSIUM 25 MG PO TABS: ORAL_TABLET | ORAL | 1 refills | Status: AC

## 2023-01-24 MED ORDER — CHLORTHALIDONE 25 MG PO TABS: ORAL_TABLET | ORAL | 1 refills | Status: AC

## 2023-01-24 NOTE — Telephone Encounter (Signed)
Tried to call pt to let him know BP meds have been sent to pharmacy. F/u Monday for recheck.

## 2023-01-24 NOTE — Telephone Encounter (Signed)
ASSESSMENT: Hypertension PLAN:  Begin combination therapy with losartan 25mg , one QAM (#30, one refill) and chlorthalidone 25mg , 1/2 QAM (#15, one refill). Followup in one week.
